# Patient Record
Sex: Male | Born: 1965 | Race: White | Hispanic: No | Marital: Married | Smoking: Current every day smoker
Health system: Southern US, Community
[De-identification: ages and names within clinical notes are randomized; demographics above are authoritative.]

## PROBLEM LIST (undated history)

## (undated) DIAGNOSIS — E669 Obesity, unspecified: Secondary | ICD-10-CM

## (undated) DIAGNOSIS — E119 Type 2 diabetes mellitus without complications: Secondary | ICD-10-CM

## (undated) DIAGNOSIS — Z720 Tobacco use: Secondary | ICD-10-CM

## (undated) DIAGNOSIS — I10 Essential (primary) hypertension: Secondary | ICD-10-CM

## (undated) DIAGNOSIS — E785 Hyperlipidemia, unspecified: Secondary | ICD-10-CM

## (undated) HISTORY — PX: KNEE SURGERY: SHX244

## (undated) HISTORY — PX: SHOULDER SURGERY: SHX246

## (undated) HISTORY — PX: BACK SURGERY: SHX140

---

## 2017-11-02 ENCOUNTER — Observation Stay
Admission: EM | Admit: 2017-11-02 | Discharge: 2017-11-03 | Disposition: A | Discharge status: Home / Self Care | Payer: Medicaid - Out of State | Attending: Internal Medicine | Admitting: Internal Medicine

## 2017-11-02 ENCOUNTER — Emergency Department: Payer: Medicaid - Out of State

## 2017-11-02 ENCOUNTER — Encounter: Payer: Self-pay | Admitting: Emergency Medicine

## 2017-11-02 ENCOUNTER — Other Ambulatory Visit: Payer: Self-pay

## 2017-11-02 DIAGNOSIS — R0789 Other chest pain: Principal | ICD-10-CM | POA: Insufficient documentation

## 2017-11-02 DIAGNOSIS — E119 Type 2 diabetes mellitus without complications: Secondary | ICD-10-CM

## 2017-11-02 DIAGNOSIS — Z7984 Long term (current) use of oral hypoglycemic drugs: Secondary | ICD-10-CM | POA: Diagnosis not present

## 2017-11-02 DIAGNOSIS — R079 Chest pain, unspecified: Secondary | ICD-10-CM

## 2017-11-02 DIAGNOSIS — Z79899 Other long term (current) drug therapy: Secondary | ICD-10-CM | POA: Insufficient documentation

## 2017-11-02 DIAGNOSIS — E785 Hyperlipidemia, unspecified: Secondary | ICD-10-CM | POA: Insufficient documentation

## 2017-11-02 DIAGNOSIS — F17200 Nicotine dependence, unspecified, uncomplicated: Secondary | ICD-10-CM | POA: Insufficient documentation

## 2017-11-02 DIAGNOSIS — I10 Essential (primary) hypertension: Secondary | ICD-10-CM | POA: Insufficient documentation

## 2017-11-02 HISTORY — DX: Type 2 diabetes mellitus without complications: E11.9

## 2017-11-02 HISTORY — DX: Obesity, unspecified: E66.9

## 2017-11-02 HISTORY — DX: Tobacco use: Z72.0

## 2017-11-02 HISTORY — DX: Essential (primary) hypertension: I10

## 2017-11-02 HISTORY — DX: Hyperlipidemia, unspecified: E78.5

## 2017-11-02 LAB — CBC
HCT: 47.5 % (ref 40.0–52.0)
Hemoglobin: 16 g/dL (ref 13.0–18.0)
MCH: 31.4 pg (ref 26.0–34.0)
MCHC: 33.7 g/dL (ref 32.0–36.0)
MCV: 93.1 fL (ref 80.0–100.0)
PLATELETS: 255 10*3/uL (ref 150–440)
RBC: 5.1 MIL/uL (ref 4.40–5.90)
RDW: 13.4 % (ref 11.5–14.5)
WBC: 13 10*3/uL — AB (ref 3.8–10.6)

## 2017-11-02 LAB — BASIC METABOLIC PANEL
Anion gap: 13 (ref 5–15)
BUN: 16 mg/dL (ref 6–20)
CHLORIDE: 101 mmol/L (ref 101–111)
CO2: 24 mmol/L (ref 22–32)
CREATININE: 1.16 mg/dL (ref 0.61–1.24)
Calcium: 9.1 mg/dL (ref 8.9–10.3)
GFR calc Af Amer: >60 mL/min (ref >60)
GFR calc non Af Amer: >60 mL/min (ref >60)
Glucose, Bld: 232 mg/dL — ABNORMAL HIGH (ref 65–99)
Potassium: 4.1 mmol/L (ref 3.5–5.1)
Sodium: 138 mmol/L (ref 135–145)

## 2017-11-02 LAB — TROPONIN I: Troponin I: <0.03 ng/mL (ref <0.03)

## 2017-11-02 LAB — GLUCOSE, CAPILLARY: GLUCOSE-CAPILLARY: 183 mg/dL — AB (ref 65–99)

## 2017-11-02 MED ORDER — ACETAMINOPHEN 325 MG PO TABS: 650.0000 mg | ORAL_TABLET | Freq: Four times a day (QID) | ORAL | Status: DC | PRN

## 2017-11-02 MED ORDER — NITROGLYCERIN 0.4 MG SL SUBL
0.4000 mg | SUBLINGUAL_TABLET | SUBLINGUAL | Status: DC | PRN
  Filled 2017-11-02 (×2): qty 1

## 2017-11-02 MED ORDER — ATORVASTATIN CALCIUM 10 MG PO TABS
10.0000 mg | ORAL_TABLET | Freq: Every day | ORAL | Status: DC
  Administered 2017-11-03: 10 mg via ORAL
  Filled 2017-11-02: qty 1

## 2017-11-02 MED ORDER — ACETAMINOPHEN 650 MG RE SUPP: 650.0000 mg | Freq: Four times a day (QID) | RECTAL | Status: DC | PRN

## 2017-11-02 MED ORDER — FENTANYL CITRATE (PF) 100 MCG/2ML IJ SOLN: 50.0000 ug | Freq: Once | INTRAMUSCULAR | Status: DC

## 2017-11-02 MED ORDER — ENOXAPARIN SODIUM 40 MG/0.4ML ~~LOC~~ SOLN: 40.0000 mg | SUBCUTANEOUS | Status: DC

## 2017-11-02 MED ORDER — ONDANSETRON HCL 4 MG PO TABS: 4.0000 mg | ORAL_TABLET | Freq: Four times a day (QID) | ORAL | Status: DC | PRN

## 2017-11-02 MED ORDER — ASPIRIN 81 MG PO CHEW
324.0000 mg | CHEWABLE_TABLET | Freq: Once | ORAL | Status: AC
  Administered 2017-11-02: 324 mg via ORAL

## 2017-11-02 MED ORDER — INSULIN ASPART 100 UNIT/ML ~~LOC~~ SOLN: 0.0000 [IU] | Freq: Every day | SUBCUTANEOUS | Status: DC

## 2017-11-02 MED ORDER — OXYCODONE HCL 5 MG PO TABS
5.0000 mg | ORAL_TABLET | ORAL | Status: DC | PRN
  Administered 2017-11-03 (×2): 5 mg via ORAL
  Filled 2017-11-02 (×2): qty 1

## 2017-11-02 MED ORDER — ASPIRIN 81 MG PO CHEW
CHEWABLE_TABLET | ORAL | Status: AC
  Filled 2017-11-02: qty 4

## 2017-11-02 MED ORDER — ONDANSETRON HCL 4 MG/2ML IJ SOLN: 4.0000 mg | Freq: Four times a day (QID) | INTRAMUSCULAR | Status: DC | PRN

## 2017-11-02 MED ORDER — FENTANYL CITRATE (PF) 100 MCG/2ML IJ SOLN
50.0000 ug | Freq: Once | INTRAMUSCULAR | Status: AC
  Administered 2017-11-02: 50 ug via INTRAVENOUS
  Filled 2017-11-02: qty 2

## 2017-11-02 MED ORDER — INSULIN ASPART 100 UNIT/ML ~~LOC~~ SOLN
0.0000 [IU] | Freq: Three times a day (TID) | SUBCUTANEOUS | Status: DC
  Administered 2017-11-03: 3 [IU] via SUBCUTANEOUS
  Filled 2017-11-02: qty 1

## 2017-11-02 MED ORDER — MORPHINE SULFATE (PF) 2 MG/ML IV SOLN
2.0000 mg | INTRAVENOUS | Status: DC | PRN
  Administered 2017-11-02 – 2017-11-03 (×2): 2 mg via INTRAVENOUS
  Filled 2017-11-02 (×2): qty 1

## 2017-11-02 MED ORDER — SODIUM CHLORIDE 0.9 % IV SOLN
INTRAVENOUS | Status: AC
  Administered 2017-11-02 – 2017-11-03 (×2): via INTRAVENOUS

## 2017-11-02 MED ORDER — NITROGLYCERIN 0.4 MG SL SUBL
0.4000 mg | SUBLINGUAL_TABLET | Freq: Once | SUBLINGUAL | Status: AC
  Administered 2017-11-02: 0.4 mg via SUBLINGUAL
  Filled 2017-11-02: qty 1

## 2017-11-02 NOTE — ED Provider Notes (Signed)
[facility] Emergency Department Provider Note  ____________________________________________   I have reviewed the triage vital signs and the nursing notes. Where available I have reviewed prior notes and, if possible and indicated, outside hospital notes.    HISTORY  Chief Complaint Chest Pain    HPI Raymond Strickland is a 52 y.o. male with a history of hypercholesterol, hypertension, diabetes, tobacco abuse, family history of CAD presents today with chest pain.  Pain is in the center of his chest as a pressure nonradiating is been there all day, not particularly exertional although sometimes seems slightly worse when he walks around.  Said no shortness of breath or pleuritic nature to it.  He denies fever or chills.  He has had a cough recently but that is better.  Also worse when he changes position in the bed that is really what he states makes it seem worse.  He has not had any tearing pain to his back, numbness or weakness.  He has never had any known cardiac history and he has not had a cardiac workup in the past.  He states he does not feel he has a pneumonia just a "smoker's cough" which is better today.  No personal family history of PE or DVT takes no exogenous estrogens has no recent surgery, no recent travel, no leg swelling.   No past medical history on file.  There are no active problems to display for this patient.   Past Surgical History:  Procedure Laterality Date  . BACK SURGERY    . KNEE SURGERY    . SHOULDER SURGERY      Prior to Admission medications   Not on File    Allergies Patient has no known allergies.  No family history on file.  Social History Social History   Tobacco Use  . Smoking status: Current Every Day Smoker  . Smokeless tobacco: Never Used  Substance Use Topics  . Alcohol use: Not on file  . Drug use: Not on file    Review of Systems Constitutional: No fever/chills Eyes: No visual changes. ENT: No  sore throat. No stiff neck no neck pain Cardiovascular:  + chest pain. Respiratory: Denies shortness of breath. Gastrointestinal:   no vomiting.  No diarrhea.  No constipation. Genitourinary: Negative for dysuria. Musculoskeletal: Negative lower extremity swelling Skin: Negative for rash. Neurological: Negative for severe headaches, focal weakness or numbness.   ____________________________________________   PHYSICAL EXAM:  VITAL SIGNS: ED Triage Vitals  Enc Vitals Group     BP 11/02/17 2029 (!) 161/88     Pulse Rate 11/02/17 2029 (!) 105     Resp 11/02/17 2029 20     Temp 11/02/17 2029 98 F (36.7 C)     Temp Source 11/02/17 2029 Oral     SpO2 11/02/17 2029 99 %     Weight 11/02/17 2027 220 lb (99.8 kg)     Height 11/02/17 2027 5\' 9"  (1.753 m)     Head Circumference --      Peak Flow --      Pain Score 11/02/17 2027 7     Pain Loc --      Pain Edu? --      Excl. in GC? --     Constitutional: Alert and oriented. Well appearing and in no acute distress. Eyes: Conjunctivae are normal Head: Atraumatic HEENT: No congestion/rhinnorhea. Mucous membranes are moist.  Oropharynx non-erythematous Neck:   Nontender with no meningismus, no masses, no stridor Cardiovascular: Normal rate,  regular rhythm. Grossly normal heart sounds.  Good peripheral circulation. Respiratory: Normal respiratory effort.  No retractions. Lungs CTAB. Chest: Tender palpation all through the right costochondral margin, this is reproductive of his pain to some degree.  No lesions or other pathology such as crepitus or flail chest noted Abdominal: Soft and nontender. No distention. No guarding no rebound Back:  There is no focal tenderness or step off.  there is no midline tenderness there are no lesions noted. there is no CVA tenderness Musculoskeletal: No lower extremity tenderness, no upper extremity tenderness. No joint effusions, no DVT signs strong distal pulses no edema Neurologic:  Normal speech and  language. No gross focal neurologic deficits are appreciated.  Skin:  Skin is warm, dry and intact. No rash noted. Psychiatric: Mood and affect are normal. Speech and behavior are normal.  ____________________________________________   LABS (all labs ordered are listed, but only abnormal results are displayed)  Labs Reviewed  BASIC METABOLIC PANEL - Abnormal; Notable for the following components:      Result Value   Glucose, Bld 232 (*)    All other components within normal limits  CBC - Abnormal; Notable for the following components:   WBC 13.0 (*)    All other components within normal limits  TROPONIN I    Pertinent labs  results that were available during my care of the patient were reviewed by me and considered in my medical decision making (see chart for details). ____________________________________________  EKG  I personally interpreted any EKGs ordered by me or triage Sinus tach rate 106, no acute ST elevation or depression nonspecific ST changes normal axis ____________________________________________  RADIOLOGY  Pertinent labs & imaging results that were available during my care of the patient were reviewed by me and considered in my medical decision making (see chart for details). If possible, patient and/or family made aware of any abnormal findings.  Dg Chest 2 View  Result Date: 11/02/2017 CLINICAL DATA:  Chest pain and shortness of Breath EXAM: CHEST - 2 VIEW COMPARISON:  None. FINDINGS: Cardiac shadow is within normal limits. The lungs are well aerated bilaterally. No focal infiltrate or sizable effusion is seen. Chronic appearing changes in the proximal right humerus are noted likely related to prior trauma. No acute bony abnormality is noted. IMPRESSION: No active cardiopulmonary disease. Electronically Signed   By: Alcide Clever M.D.   On: 11/02/2017 20:47   ____________________________________________    PROCEDURES  Procedure(s) performed:  None  Procedures  Critical Care performed: None  ____________________________________________   INITIAL IMPRESSION / ASSESSMENT AND PLAN / ED COURSE  Pertinent labs & imaging results that were available during my care of the patient were reviewed by me and considered in my medical decision making (see chart for details).  Patient here with reproducible chest wall pain however, he also has pretty much every risk factor I can think of for CAD including high cholesterol hyperlipidemia tobacco abuse diabetes and family history and obesity.  Thus even though I am reassured by his workup thus far as well as his findings, I think it would be in his best interest to spend the night and get a stress test.  I do not think the patient at this time shows symptoms of PE or dissection or pneumonia pneumothorax.    ____________________________________________   FINAL CLINICAL IMPRESSION(S) / ED DIAGNOSES  Final diagnoses:  None      This chart was dictated using voice recognition software.  Despite best efforts to proofread,  errors can occur which can change meaning.      Jeanmarie PlantMcShane, Shalunda Lindh A, MD 11/02/17 2137

## 2017-11-02 NOTE — ED Triage Notes (Signed)
.  Patient ambulatory to triage with steady gait, without difficulty or distress noted; pt reports since this morning having midsternal CP, [facility], denies hx of same

## 2017-11-02 NOTE — ED Notes (Signed)
Attempted to call report, RN passing medications and will call back shortly

## 2017-11-02 NOTE — ED Notes (Signed)
Sam RN, aware of bed assigned  

## 2017-11-02 NOTE — H&P (Signed)
[facility] [place] at [facility]   PATIENT NAME: Raymond Strickland    MR#:  [id number]  DATE OF BIRTH:  [birth year]  DATE OF ADMISSION:  11/02/2017  PRIMARY CARE PHYSICIAN: Patient, No Pcp Per   REQUESTING/REFERRING PHYSICIAN: Alphonzo Lemmings, MD  CHIEF COMPLAINT:   Chief Complaint  Patient presents with  . Chest Pain    HISTORY OF PRESENT ILLNESS:  Raymond Strickland  is a 52 y.o. male who presents with pain.  Patient states this pain started initially on the right side of his chest.  He noticed it when he woke up this morning.  It is been a persistent pain, and throughout the day he noticed it more in the center of his chest as well.  He states that he does also feel some tightness up towards his left neck.  His initial workup here in the ED was largely within normal limits, though he does have a number of risk factors for heart disease including family history, diabetes, tobacco abuse.  He denies any associated diaphoresis, shortness of breath, or other symptoms.  Hospitalist were called for admission for further evaluation.  PAST MEDICAL HISTORY:   Past Medical History:  Diagnosis Date  . Diabetes (HCC)   . HLD (hyperlipidemia)   . Hypertension   . Obesity   . Tobacco abuse      PAST SURGICAL HISTORY:   Past Surgical History:  Procedure Laterality Date  . BACK SURGERY    . KNEE SURGERY    . SHOULDER SURGERY       SOCIAL HISTORY:   Social History   Tobacco Use  . Smoking status: Current Every Day Smoker  . Smokeless tobacco: Never Used  Substance Use Topics  . Alcohol use: Yes    Alcohol/week: 0.0 - 0.6 oz    Frequency: Never     FAMILY HISTORY:   Family History  Problem Relation Age of Onset  . Heart disease Father      DRUG ALLERGIES:  No Known Allergies  MEDICATIONS AT HOME:   Prior to Admission medications   Medication Sig Start Date End Date Taking? Authorizing Provider  atorvastatin (LIPITOR) 10 MG tablet Take 10  mg by mouth daily. 10/19/17   [provider]  metFORMIN (GLUCOPHAGE) 500 MG tablet Take 1 tablet by mouth 2 (two) times daily. 10/21/17   [provider]    REVIEW OF SYSTEMS:  Review of Systems  Constitutional: Negative for chills, fever, malaise/fatigue and weight loss.  HENT: Negative for ear pain, hearing loss and tinnitus.   Eyes: Negative for blurred vision, double vision, pain and redness.  Respiratory: Negative for cough, hemoptysis and shortness of breath.   Cardiovascular: Positive for chest pain. Negative for palpitations, orthopnea and leg swelling.  Gastrointestinal: Negative for abdominal pain, constipation, diarrhea, nausea and vomiting.  Genitourinary: Negative for dysuria, frequency and hematuria.  Musculoskeletal: Negative for back pain, joint pain and neck pain.  Skin:       No acne, rash, or lesions  Neurological: Negative for dizziness, tremors, focal weakness and weakness.  Endo/Heme/Allergies: Negative for polydipsia. Does not bruise/bleed easily.  Psychiatric/Behavioral: Negative for depression. The patient is not nervous/anxious and does not have insomnia.      VITAL SIGNS:   Vitals:   11/02/17 2027 11/02/17 2029 11/02/17 2100 11/02/17 2130  BP:  (!) 161/88 (!) 159/83 133/81  Pulse:  (!) 105 95 91  Resp:  20 20 18   Temp:  98 F (36.7 C)  TempSrc:  Oral    SpO2:  99% 96% 95%  Weight: 99.8 kg (220 lb)     Height: 5\' 9"  (1.753 m)      Wt Readings from Last 3 Encounters:  11/02/17 99.8 kg (220 lb)    PHYSICAL EXAMINATION:  Physical Exam  Vitals reviewed. Constitutional: He is oriented to person, place, and time. He appears well-developed and well-nourished. No distress.  HENT:  Head: Normocephalic and atraumatic.  Mouth/Throat: Oropharynx is clear and moist.  Eyes: Conjunctivae and EOM are normal. Pupils are equal, round, and reactive to light. No scleral icterus.  Neck: Normal range of motion. Neck supple. No JVD present. No  thyromegaly present.  Cardiovascular: Normal rate, regular rhythm and intact distal pulses. Exam reveals no gallop and no friction rub.  No murmur heard. Respiratory: Effort normal and breath sounds normal. No respiratory distress. He has no wheezes. He has no rales. He exhibits tenderness.  GI: Soft. Bowel sounds are normal. He exhibits no distension. There is no tenderness.  Musculoskeletal: Normal range of motion. He exhibits no edema.  No arthritis, no gout  Lymphadenopathy:    He has no cervical adenopathy.  Neurological: He is alert and oriented to person, place, and time. No cranial nerve deficit.  No dysarthria, no aphasia  Skin: Skin is warm and dry. No rash noted. No erythema.  Psychiatric: He has a normal mood and affect. His behavior is normal. Judgment and thought content normal.    LABORATORY PANEL:   CBC Recent Labs  Lab 11/02/17 2030  WBC 13.0*  HGB 16.0  HCT 47.5  PLT 255   ------------------------------------------------------------------------------------------------------------------  Chemistries  Recent Labs  Lab 11/02/17 2030  NA 138  K 4.1  CL 101  CO2 24  GLUCOSE 232*  BUN 16  CREATININE 1.16  CALCIUM 9.1   ------------------------------------------------------------------------------------------------------------------  Cardiac Enzymes Recent Labs  Lab 11/02/17 2030  TROPONINI <0.03   ------------------------------------------------------------------------------------------------------------------  RADIOLOGY:  Dg Chest 2 View  Result Date: 11/02/2017 CLINICAL DATA:  Chest pain and shortness of Breath EXAM: CHEST - 2 VIEW COMPARISON:  None. FINDINGS: Cardiac shadow is within normal limits. The lungs are well aerated bilaterally. No focal infiltrate or sizable effusion is seen. Chronic appearing changes in the proximal right humerus are noted likely related to prior trauma. No acute bony abnormality is noted. IMPRESSION: No active  cardiopulmonary disease. Electronically Signed   By: Alcide Clever M.D.   On: 11/02/2017 20:47    EKG:   Orders placed or performed during the hospital encounter of 11/02/17  . ED EKG within 10 minutes  . ED EKG within 10 minutes    IMPRESSION AND PLAN:  Principal Problem:   Atypical chest pain -patient's chest pain is somewhat reproducible, but he also notes that there is a component which is not reproducible.  Unclear etiology at this time.  Initial troponin normal.  We will trend his cardiac enzymes tonight, get an echocardiogram and a cardiology consult. Active Problems:   HTN (hypertension) -continue home meds   Diabetes (HCC) -sliding scale insulin with corresponding glucose checks   HLD (hyperlipidemia) -home dose antilipid  All the records are reviewed and case discussed with ED provider. Management plans discussed with the patient and/or family.  DVT PROPHYLAXIS: SubQ lovenox  GI PROPHYLAXIS: None  ADMISSION STATUS: Observation  CODE STATUS: Full Code Status History    This patient does not have a recorded code status. Please follow your organizational policy for patients in this situation.  TOTAL TIME TAKING CARE OF THIS PATIENT: 40 minutes.   Tyeesha Riker FIELDING 11/02/2017, 10:10 PM  [facility]  Office  [phone]  CC: Primary care physician; Patient, No Pcp Per  Note:  This document was prepared using Dragon voice recognition software and may include unintentional dictation errors.

## 2017-11-02 NOTE — ED Notes (Signed)
Rolling upstairs with pt, 2A notified and gave report bedside

## 2017-11-03 ENCOUNTER — Observation Stay (HOSPITAL_BASED_OUTPATIENT_CLINIC_OR_DEPARTMENT_OTHER)
Admit: 2017-11-03 | Discharge: 2017-11-03 | Disposition: A | Discharge status: Home / Self Care | Payer: Medicaid - Out of State | Attending: Internal Medicine | Admitting: Internal Medicine

## 2017-11-03 ENCOUNTER — Observation Stay (HOSPITAL_BASED_OUTPATIENT_CLINIC_OR_DEPARTMENT_OTHER): Payer: Medicaid - Out of State

## 2017-11-03 DIAGNOSIS — R0789 Other chest pain: Secondary | ICD-10-CM

## 2017-11-03 DIAGNOSIS — I340 Nonrheumatic mitral (valve) insufficiency: Secondary | ICD-10-CM

## 2017-11-03 LAB — BASIC METABOLIC PANEL
Anion gap: 8 (ref 5–15)
BUN: 12 mg/dL (ref 6–20)
CALCIUM: 8.6 mg/dL — AB (ref 8.9–10.3)
CO2: 25 mmol/L (ref 22–32)
CREATININE: 0.97 mg/dL (ref 0.61–1.24)
Chloride: 104 mmol/L (ref 101–111)
GFR calc Af Amer: >60 mL/min (ref >60)
Glucose, Bld: 223 mg/dL — ABNORMAL HIGH (ref 65–99)
POTASSIUM: 4.2 mmol/L (ref 3.5–5.1)
SODIUM: 137 mmol/L (ref 135–145)

## 2017-11-03 LAB — CBC
HEMATOCRIT: 44.4 % (ref 40.0–52.0)
Hemoglobin: 15.1 g/dL (ref 13.0–18.0)
MCH: 31.6 pg (ref 26.0–34.0)
MCHC: 34 g/dL (ref 32.0–36.0)
MCV: 93 fL (ref 80.0–100.0)
PLATELETS: 232 10*3/uL (ref 150–440)
RBC: 4.78 MIL/uL (ref 4.40–5.90)
RDW: 13.5 % (ref 11.5–14.5)
WBC: 10.8 10*3/uL — AB (ref 3.8–10.6)

## 2017-11-03 LAB — ECHOCARDIOGRAM COMPLETE
HEIGHTINCHES: 69 in
WEIGHTICAEL: 3550.4 [oz_av]

## 2017-11-03 LAB — GLUCOSE, CAPILLARY
GLUCOSE-CAPILLARY: 163 mg/dL — AB (ref 65–99)
GLUCOSE-CAPILLARY: 143 mg/dL — AB (ref 65–99)
Glucose-Capillary: 243 mg/dL — ABNORMAL HIGH (ref 65–99)

## 2017-11-03 LAB — LIPID PANEL
Cholesterol: 146 mg/dL (ref 0–200)
HDL: 33 mg/dL — ABNORMAL LOW (ref >40)
LDL Cholesterol: 99 mg/dL (ref 0–99)
Total CHOL/HDL Ratio: 4.4 RATIO
Triglycerides: 71 mg/dL (ref <150)
VLDL: 14 mg/dL (ref 0–40)

## 2017-11-03 LAB — NM MYOCAR MULTI W/SPECT W/WALL MOTION / EF
CHL CUP NUCLEAR SDS: 0
CHL CUP NUCLEAR SRS: 3
LV dias vol: 97 mL (ref 62–150)
LV sys vol: 43 mL
NUC STRESS TID: 0.92
Peak HR: 110 {beats}/min
Percent HR: 64 %
Rest HR: 76 {beats}/min
SSS: 0

## 2017-11-03 LAB — TROPONIN I: Troponin I: <0.03 ng/mL (ref <0.03)

## 2017-11-03 LAB — FIBRIN DERIVATIVES D-DIMER (ARMC ONLY): Fibrin derivatives D-dimer (ARMC): 234.28 ng/mL (FEU) (ref 0.00–499.00)

## 2017-11-03 LAB — MAGNESIUM: MAGNESIUM: 2.1 mg/dL (ref 1.7–2.4)

## 2017-11-03 LAB — TSH: TSH: 0.465 u[IU]/mL (ref 0.350–4.500)

## 2017-11-03 MED ORDER — IPRATROPIUM-ALBUTEROL 0.5-2.5 (3) MG/3ML IN SOLN: 3.0000 mL | RESPIRATORY_TRACT | Status: DC | PRN

## 2017-11-03 MED ORDER — REGADENOSON 0.4 MG/5ML IV SOLN
0.4000 mg | Freq: Once | INTRAVENOUS | Status: AC
  Administered 2017-11-03: 0.4 mg via INTRAVENOUS

## 2017-11-03 MED ORDER — IPRATROPIUM-ALBUTEROL 0.5-2.5 (3) MG/3ML IN SOLN
3.0000 mL | Freq: Four times a day (QID) | RESPIRATORY_TRACT | Status: DC
  Filled 2017-11-03: qty 3

## 2017-11-03 MED ORDER — TECHNETIUM TC 99M TETROFOSMIN IV KIT
13.3000 | PACK | Freq: Once | INTRAVENOUS | Status: AC | PRN
  Administered 2017-11-03: 13.3 via INTRAVENOUS

## 2017-11-03 MED ORDER — FLUDEOXYGLUCOSE F - 18 (FDG) INJECTION: 32.1000 | Freq: Once | INTRAVENOUS | Status: DC | PRN

## 2017-11-03 MED ORDER — ASPIRIN EC 81 MG PO TBEC: 81.0000 mg | DELAYED_RELEASE_TABLET | Freq: Every day | ORAL | 0 refills | Status: AC

## 2017-11-03 MED ORDER — TECHNETIUM TC 99M TETROFOSMIN IV KIT
32.1000 | PACK | Freq: Once | INTRAVENOUS | Status: AC | PRN
  Administered 2017-11-03: 32.1 via INTRAVENOUS

## 2017-11-03 MED ORDER — NICOTINE 14 MG/24HR TD PT24: 14.0000 mg | MEDICATED_PATCH | Freq: Every day | TRANSDERMAL | 0 refills | Status: AC

## 2017-11-03 MED ORDER — ALBUTEROL SULFATE HFA 108 (90 BASE) MCG/ACT IN AERS: 2.0000 | INHALATION_SPRAY | Freq: Four times a day (QID) | RESPIRATORY_TRACT | 0 refills | Status: AC | PRN

## 2017-11-03 MED ORDER — NICOTINE 14 MG/24HR TD PT24
14.0000 mg | MEDICATED_PATCH | Freq: Every day | TRANSDERMAL | Status: DC
  Administered 2017-11-03: 14 mg via TRANSDERMAL
  Filled 2017-11-03: qty 1

## 2017-11-03 NOTE — Progress Notes (Signed)
[facility] - [place] at [facility]   PATIENT NAME: Raymond Strickland    MR#:  [id number]  DATE OF BIRTH:  [birth year]  SUBJECTIVE:  CHIEF COMPLAINT:   Chief Complaint  Patient presents with  . Chest Pain  Complained of chest tightness with deep breathing only  REVIEW OF SYSTEMS:  CONSTITUTIONAL: No fever, fatigue or weakness.  EYES: No blurred or double vision.  EARS, NOSE, AND THROAT: No tinnitus or ear pain.  RESPIRATORY: No cough, shortness of breath, wheezing or hemoptysis.  CARDIOVASCULAR: No chest pain, orthopnea, edema.  GASTROINTESTINAL: No nausea, vomiting, diarrhea or abdominal pain.  GENITOURINARY: No dysuria, hematuria.  ENDOCRINE: No polyuria, nocturia,  HEMATOLOGY: No anemia, easy bruising or bleeding SKIN: No rash or lesion. MUSCULOSKELETAL: No joint pain or arthritis.   NEUROLOGIC: No tingling, numbness, weakness.  PSYCHIATRY: No anxiety or depression.   ROS  DRUG ALLERGIES:  No Known Allergies  VITALS:  Blood pressure (!) 107/56, pulse 80, temperature 97.8 F (36.6 C), temperature source Oral, resp. rate 18, height 5\' 9"  (1.753 m), weight 100.7 kg (221 lb 14.4 oz), SpO2 95 %.  PHYSICAL EXAMINATION:  GENERAL:  52 y.o.-year-old patient lying in the bed with no acute distress.  EYES: Pupils equal, round, reactive to light and accommodation. No scleral icterus. Extraocular muscles intact.  HEENT: Head atraumatic, normocephalic. Oropharynx and nasopharynx clear.  NECK:  Supple, no jugular venous distention. No thyroid enlargement, no tenderness.  LUNGS: Normal breath sounds bilaterally, no wheezing, rales,rhonchi or crepitation. No use of accessory muscles of respiration.  CARDIOVASCULAR: S1, S2 normal. No murmurs, rubs, or gallops.  ABDOMEN: Soft, nontender, nondistended. Bowel sounds present. No organomegaly or mass.  EXTREMITIES: No pedal edema, cyanosis, or clubbing.  NEUROLOGIC: Cranial nerves II through XII are intact. Muscle  strength 5/5 in all extremities. Sensation intact. Gait not checked.  PSYCHIATRIC: The patient is alert and oriented x 3.  SKIN: No obvious rash, lesion, or ulcer.   Physical Exam LABORATORY PANEL:   CBC Recent Labs  Lab 11/03/17 0507  WBC 10.8*  HGB 15.1  HCT 44.4  PLT 232   ------------------------------------------------------------------------------------------------------------------  Chemistries  Recent Labs  Lab 11/03/17 0507 11/03/17 1213  NA 137  --   K 4.2  --   CL 104  --   CO2 25  --   GLUCOSE 223*  --   BUN 12  --   CREATININE 0.97  --   CALCIUM 8.6*  --   MG  --  2.1   ------------------------------------------------------------------------------------------------------------------  Cardiac Enzymes Recent Labs  Lab 11/03/17 0507 11/03/17 1213  TROPONINI <0.03 <0.03   ------------------------------------------------------------------------------------------------------------------  RADIOLOGY:  Dg Chest 2 View  Result Date: 11/02/2017 CLINICAL DATA:  Chest pain and shortness of Breath EXAM: CHEST - 2 VIEW COMPARISON:  None. FINDINGS: Cardiac shadow is within normal limits. The lungs are well aerated bilaterally. No focal infiltrate or sizable effusion is seen. Chronic appearing changes in the proximal right humerus are noted likely related to prior trauma. No acute bony abnormality is noted. IMPRESSION: No active cardiopulmonary disease. Electronically Signed   By: Alcide Clever M.D.   On: 11/02/2017 20:47    ASSESSMENT AND PLAN:  *  Atypical chest pain  For nuclear medicine stress testing-if negative will discharge to home with outpatient follow-up with primary care provider and to 3 days for reevaluation Other possible etiologies include probable acute pleuritic process, mild COPD, acute bronchitis, pleurisy, acute viral syndrome Patient advised to quit smoking, started on inhaler  as needed  *Chronic tobacco smoking abuse/dependency Nicotine patch  and cessation counseling while in house  *Chronic diabetes mellitus type 2 Stable on current regiment  *Chronic benign essential hypertension Stable continue home regiment  All the records are reviewed and case discussed with Care Management/Social Workerr. Management plans discussed with the patient, family and they are in agreement.  CODE STATUS: full  TOTAL TIME TAKING CARE OF THIS PATIENT: 35 minutes.     POSSIBLE D/C IN 0-1 DAYS, DEPENDING ON CLINICAL CONDITION.   Evelena AsaMontell D Demarrio Menges M.D on 11/03/2017   Between 7am to 6pm - Pager - [phone]  After 6pm go to [email or web address] - password [email or web address]  [facility] [place] [facility]  Office  [phone]  CC: Primary care physician; Patient, No Pcp Per  Note: This dictation was prepared with Dragon dictation along with smaller phrase technology. Any transcriptional errors that result from this process are unintentional.

## 2017-11-03 NOTE — Progress Notes (Signed)
*  PRELIMINARY RESULTS* Echocardiogram 2D Echocardiogram has been performed.  Raymond BlueHege, Raymond Strickland 11/03/2017, 12:14 PM

## 2017-11-03 NOTE — Progress Notes (Signed)
Pt complains of increased pain with inspiration. MD notified. RN suggest d-dimer add to morning labs. Order received. I will continue to assess.

## 2017-11-03 NOTE — Consult Note (Signed)
Cardiology Consultation:   Patient ID: Raymond Strickland; [id number]; 1966/06/07   Admit date: 11/02/2017 Date of Consult: 11/03/2017  Primary Care Provider: Patient, No Pcp Per Primary Cardiologist: New to [facility] - consult by Arida   Patient Profile:   Raymond Strickland is a 52 y.o. male with a hx of DM, HTN, HLD, ongoing tobacco abuse, and obesity who is being seen today for the evaluation of atypical/pleuritic chest pain at the request of Dr. Anne Hahn.  History of Present Illness:   Raymond Strickland has no previously known cardiac history. He is a current everyday smoker of 1 to 1.5 packs daily for the past 30 years. 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 His mother and father have known CAD, developing in their 92s. He has recently been dealing with a URI, that is improving. On 3/6 he developed right-sided, pleuritic chest pain that is only noted with coughing and deep inspiration. Pain is not exertional. There is associated SOB, otherwise no associated symptoms. Because of his chest pain he came to the ED.   Upon the patient's arrival to [facility] they were found to have BP 161 systolic trending to 112 systolic, HR 105-->73 bpm, temp 98, oxygen saturation 99% on room air, weight 220 pounds. EKG showed sinus tachycardia with nonspecific st/t changes, CXR showed no active cardiopulmonary disease. Labs showed troponin negative x 2, WBC 13.0-->10.8, HGB 16.0, PLT 255, glucose 232-->223, SCr 1.16-->0.97, K+ 4.1. In the ED he was given ASA 324 mg x 1, Fentanyl, morphine, and SL NTG.   Past Medical History:  Diagnosis Date  . Diabetes (HCC)   . HLD (hyperlipidemia)   . Hypertension   . Obesity   . Tobacco abuse     Past Surgical History:  Procedure Laterality Date  . BACK SURGERY    . KNEE SURGERY    . SHOULDER SURGERY       Home Meds: Prior to Admission medications   Medication Sig Start Date End Date Taking? Authorizing Provider  metFORMIN  (GLUCOPHAGE) 500 MG tablet Take 1 tablet by mouth 2 (two) times daily. 10/21/17  Yes [provider]  atorvastatin (LIPITOR) 10 MG tablet Take 10 mg by mouth daily. 10/19/17   [provider]    Inpatient Medications: Scheduled Meds: . aspirin      . atorvastatin  10 mg Oral Daily  . enoxaparin (LOVENOX) injection  40 mg Subcutaneous Q24H  . insulin aspart  0-5 Units Subcutaneous QHS  . insulin aspart  0-9 Units Subcutaneous TID WC   Continuous Infusions: . sodium chloride 50 mL/hr at 11/02/17 2322   PRN Meds: acetaminophen **OR** acetaminophen, morphine injection, [COMPLETED] nitroGLYCERIN **FOLLOWED BY** nitroGLYCERIN, ondansetron **OR** ondansetron (ZOFRAN) IV, oxyCODONE  Allergies:  No Known Allergies  Social History:   Social History   Socioeconomic History  . Marital status: Married    Spouse name: Not on file  . Number of children: Not on file  . Years of education: Not on file  . Highest education level: Not on file  Social Needs  . Financial resource strain: Not on file  . Food insecurity - worry: Not on file  . Food insecurity - inability: Not on file  . Transportation needs - medical: Not on file  . Transportation needs - non-medical: Not on file  Occupational History  . Not on file  Tobacco Use  . Smoking status: Current Every Day Smoker  . Smokeless tobacco: Never Used  Substance  and Sexual Activity  . Alcohol use: Yes    Alcohol/week: 0.0 - 0.6 oz    Frequency: Never  . Drug use: No  . Sexual activity: Not on file  Other Topics Concern  . Not on file  Social History Narrative  . Not on file     Family History:   Family History  Problem Relation Age of Onset  . Heart disease Father     ROS:  Review of Systems  Constitutional: Positive for malaise/fatigue. Negative for chills, diaphoresis, fever and weight loss.  HENT: Negative for congestion.   Eyes: Negative for discharge and redness.  Respiratory: Positive for shortness of  breath. Negative for cough, hemoptysis, sputum production and wheezing.   Cardiovascular: Positive for chest pain. Negative for palpitations, orthopnea, claudication, leg swelling and PND.  Gastrointestinal: Negative for abdominal pain, blood in stool, heartburn, melena, nausea and vomiting.  Genitourinary: Negative for hematuria.  Musculoskeletal: Negative for falls and myalgias.  Skin: Negative for rash.  Neurological: Positive for weakness. Negative for dizziness, tingling, tremors, sensory change, speech change, focal weakness and loss of consciousness.  Endo/Heme/Allergies: Does not bruise/bleed easily.  Psychiatric/Behavioral: Negative for substance abuse. The patient is not nervous/anxious.   All other systems reviewed and are negative.     Physical Exam/Data:   Vitals:   11/02/17 2309 11/02/17 2339 11/03/17 0522 11/03/17 0533  BP: (!) 145/87 132/72 (!) 100/54 112/61  Pulse: 92 90 84 84  Resp: 16  18   Temp: 97.9 F (36.6 C)  98.6 F (37 C)   TempSrc: Oral     SpO2: 93%  98%   Weight:      Height:        Intake/Output Summary (Last 24 hours) at 11/03/2017 0817 Last data filed at 11/03/2017 0238 Gross per 24 hour  Intake 150 ml  Output 900 ml  Net -750 ml   Filed Weights   11/02/17 2027 11/02/17 2306  Weight: 220 lb (99.8 kg) 221 lb 14.4 oz (100.7 kg)   Body mass index is 32.77 kg/m.   Physical Exam: General: Well developed, well nourished, in no acute distress. Head: Normocephalic, atraumatic, sclera non-icteric, no xanthomas, nares without discharge. Neck: Negative for carotid bruits. JVD not elevated. Lungs: Clear bilaterally to auscultation without wheezes, rales, or rhonchi. Breathing is unlabored. Heart: RRR with S1 S2. No murmurs, rubs, or gallops appreciated. Abdomen: Soft, non-tender, non-distended with normoactive bowel sounds. No hepatomegaly. No rebound/guarding. No obvious abdominal masses. Msk:  Strength and tone appear normal for age. Extremities: No  clubbing or cyanosis. No edema. Distal pedal pulses are 2+ and equal bilaterally. Neuro: Alert and oriented X 3. No facial asymmetry. No focal deficit. Moves all extremities spontaneously. Psych:  Responds to questions appropriately with a normal affect.   EKG:  The EKG was personally reviewed and demonstrates: sinus tachycardia, 106 bpm, nonspecific st/t changes Telemetry:  Telemetry was personally reviewed and demonstrates: NSR with occasional PVCs  Weights: [facility] Weights   11/02/17 2027 11/02/17 2306  Weight: 220 lb (99.8 kg) 221 lb 14.4 oz (100.7 kg)    Relevant CV Studies: none  Laboratory Data:  Chemistry Recent Labs  Lab 11/02/17 2030 11/03/17 0507  NA 138 137  K 4.1 4.2  CL 101 104  CO2 24 25  GLUCOSE 232* 223*  BUN 16 12  CREATININE 1.16 0.97  CALCIUM 9.1 8.6*  GFRNONAA >60 >60  GFRAA >60 >60  ANIONGAP 13 8    No results for input(s): PROT,  ALBUMIN, AST, ALT, ALKPHOS, BILITOT in the last 168 hours. Hematology Recent Labs  Lab 11/02/17 2030 11/03/17 0507  WBC 13.0* 10.8*  RBC 5.10 4.78  HGB 16.0 15.1  HCT 47.5 44.4  MCV 93.1 93.0  MCH 31.4 31.6  MCHC 33.7 34.0  RDW 13.4 13.5  PLT 255 232   Cardiac Enzymes Recent Labs  Lab 11/02/17 2030 11/02/17 2359 11/03/17 0507  TROPONINI <0.03 <0.03 <0.03   No results for input(s): TROPIPOC in the last 168 hours.  BNPNo results for input(s): BNP, PROBNP in the last 168 hours.  DDimer No results for input(s): DDIMER in the last 168 hours.  Radiology/Studies:  Dg Chest 2 View  Result Date: 11/02/2017 IMPRESSION: No active cardiopulmonary disease. Electronically Signed   By: Alcide CleverMark  Lukens M.D.   On: 11/02/2017 20:47    Assessment and Plan:   1. Chest pain with moderate risk for cardiac etiology: -Currently chest pain free unless he takes a deep breath or coughs -Pain is very atypical, though he does have significant risk factors for CAD as detailed above -Schedule nuclear stress test today, if normal no  further ischemic workup at this time and recommend IM evaluate him for noncardiac etiologies for his symptoms, including evaluation for PE -Echo -ASA  2. DM: -Metformin on hold while admitted -SSI per IM -Check A1c  3. HTN: -Improved -BP has been on the soft side this morning which precludes addition of medications  4. HLD: -Check lipid panel -Lipitor, titrate as needed  5. PVCs: -Asymptomatic  -Check magnesium and TSH -Potassium at goal -Consider outpatient cardiac monitoring to quantify  -Check echo as above  6. SOB: -Cardiac evaluation as above -Recommend evaluation for PE as well given the pleuritic nature of his pain and SOB as well as significant risk factors for PE, defer to IM  7. Tobacco abuse: -Cessation advised   8. Obesity: -Weight loss advised   For questions or updates, please contact [facility] Please consult [email or web address] for contact info under Cardiology/STEMI.   Signed, Eula Listenyan Erian Lariviere, PA-C [facility] Pager: [phone] 11/03/2017, 8:17 AM

## 2017-11-04 LAB — HIV ANTIBODY (ROUTINE TESTING W REFLEX): HIV SCREEN 4TH GENERATION: NONREACTIVE

## 2017-11-04 LAB — HEMOGLOBIN A1C
Hgb A1c MFr Bld: 9.2 % — ABNORMAL HIGH (ref 4.8–5.6)
MEAN PLASMA GLUCOSE: 217 mg/dL

## 2017-11-04 NOTE — Discharge Summary (Deleted)
  The note originally documented on this encounter has been moved the the encounter in which it belongs.  

## 2017-11-04 NOTE — Discharge Summary (Signed)
[facility] [place] [facility]   PATIENT NAME: Raymond Strickland    MR#:  [id number]  DATE OF BIRTH:  Jan 17, 1966  DATE OF ADMISSION:  11/03/2017 ADMITTING PHYSICIAN: No admitting provider for patient encounter.  DATE OF DISCHARGE: 11/03/2017 11:59 PM  PRIMARY CARE PHYSICIAN: Patient, No Pcp Per    ADMISSION DIAGNOSIS:  No admission diagnoses are documented for this encounter.  DISCHARGE DIAGNOSIS:  Active Problems:   * No active hospital problems. *   SECONDARY DIAGNOSIS:   Past Medical History:  Diagnosis Date  . Diabetes (HCC)   . HLD (hyperlipidemia)   . Hypertension   . Obesity   . Tobacco abuse     HOSPITAL COURSE:  *Atypical chest pain  Referred to the observation unit, cardiac enzymes negative for acute coronary syndrome, cardiac nuclear medicine stress testing was a negative study, patient was discharged home with follow-up with primary care provider   *Chronic tobacco smoking abuse/dependency Nicotine patch and cessation counseling while in house  *Chronic diabetes mellitus type 2 Stable on current regiment  *Chronic benign essential hypertension Stable continue home regiment  DISCHARGE CONDITIONS:  On the day of discharge patient is afebrile, hemodynamically stable, tolerating diet, ready for discharge to home, for more specific details please see chart   CONSULTS OBTAINED:    DRUG ALLERGIES:  No Known Allergies  DISCHARGE MEDICATIONS:   Allergies as of 11/03/2017   No Known Allergies     Medication List    Notice   This visit has been closed. A record of the med list at the time of the visit is not available.      DISCHARGE INSTRUCTIONS:    If you experience worsening of your admission symptoms, develop shortness of breath, life threatening emergency, suicidal or homicidal thoughts you must seek medical attention immediately by calling 911 or calling your MD immediately  if symptoms less severe.  You  Must read complete instructions/literature along with all the possible adverse reactions/side effects for all the Medicines you take and that have been prescribed to you. Take any new Medicines after you have completely understood and accept all the possible adverse reactions/side effects.   Please note  You were cared for by a hospitalist during your hospital stay. If you have any questions about your discharge medications or the care you received while you were in the hospital after you are discharged, you can call the unit and asked to speak with the hospitalist on call if the hospitalist that took care of you is not available. Once you are discharged, your primary care physician will handle any further medical issues. Please note that NO REFILLS for any discharge medications will be authorized once you are discharged, as it is imperative that you return to your primary care physician (or establish a relationship with a primary care physician if you do not have one) for your aftercare needs so that they can reassess your need for medications and monitor your lab values.    Today   CHIEF COMPLAINT:  No chief complaint on file.   HISTORY OF PRESENT ILLNESS:  52 y.o. male who presents with pain.  Patient states this pain started initially on the right side of his chest.  He noticed it when he woke up this morning.  It is been a persistent pain, and throughout the day he noticed it more in the center of his chest as well.  He states that he does also feel some tightness up towards  his left neck.  His initial workup here in the ED was largely within normal limits, though he does have a number of risk factors for heart disease including family history, diabetes, tobacco abuse.  He denies any associated diaphoresis, shortness of breath, or other symptoms.  Hospitalist were called for admission for further evaluation.  VITAL SIGNS:  There were no vitals taken for this visit.  I/O:    Intake/Output  Summary (Last 24 hours) at 11/04/2017 0718 Last data filed at 11/03/2017 1247 Gross per 24 hour  Intake -  Output 700 ml  Net -700 ml    PHYSICAL EXAMINATION:  GENERAL:  52 y.o.-year-old patient lying in the bed with no acute distress.  EYES: Pupils equal, round, reactive to light and accommodation. No scleral icterus. Extraocular muscles intact.  HEENT: Head atraumatic, normocephalic. Oropharynx and nasopharynx clear.  NECK:  Supple, no jugular venous distention. No thyroid enlargement, no tenderness.  LUNGS: Normal breath sounds bilaterally, no wheezing, rales,rhonchi or crepitation. No use of accessory muscles of respiration.  CARDIOVASCULAR: S1, S2 normal. No murmurs, rubs, or gallops.  ABDOMEN: Soft, non-tender, non-distended. Bowel sounds present. No organomegaly or mass.  EXTREMITIES: No pedal edema, cyanosis, or clubbing.  NEUROLOGIC: Cranial nerves II through XII are intact. Muscle strength 5/5 in all extremities. Sensation intact. Gait not checked.  PSYCHIATRIC: The patient is alert and oriented x 3.  SKIN: No obvious rash, lesion, or ulcer.   DATA REVIEW:   CBC Recent Labs  Lab 11/03/17 0507  WBC 10.8*  HGB 15.1  HCT 44.4  PLT 232    Chemistries  Recent Labs  Lab 11/03/17 0507 11/03/17 1213  NA 137  --   K 4.2  --   CL 104  --   CO2 25  --   GLUCOSE 223*  --   BUN 12  --   CREATININE 0.97  --   CALCIUM 8.6*  --   MG  --  2.1    Cardiac Enzymes Recent Labs  Lab 11/03/17 1213  TROPONINI <0.03    Microbiology Results  No results found for this or any previous visit.  RADIOLOGY:  Dg Chest 2 View  Result Date: 11/02/2017 CLINICAL DATA:  Chest pain and shortness of Breath EXAM: CHEST - 2 VIEW COMPARISON:  None. FINDINGS: Cardiac shadow is within normal limits. The lungs are well aerated bilaterally. No focal infiltrate or sizable effusion is seen. Chronic appearing changes in the proximal right humerus are noted likely related to prior trauma. No acute  bony abnormality is noted. IMPRESSION: No active cardiopulmonary disease. Electronically Signed   By: Alcide CleverMark  Lukens M.D.   On: 11/02/2017 20:47   Nm Myocar Multi W/spect W/wall Motion / Ef  Result Date: 11/03/2017 Pharmacological myocardial perfusion imaging study with no significant  ischemia Normal wall motion, EF estimated at 46% No EKG changes concerning for ischemia at peak stress or in recovery. Low risk scan, echocardiogram documenting normal ejection fraction greater than 55% Signed, Dossie Arbourim Gollan, MD, Ph.D [facility]    EKG:   Orders placed or performed during the hospital encounter of 11/02/17  . ED EKG within 10 minutes  . ED EKG within 10 minutes      Management plans discussed with the patient, family and they are in agreement.  CODE STATUS:  Code Status History    Date Active Date Inactive Code Status Order ID Comments User Context   11/02/2017 23:16 11/03/2017 21:26 Full Code [id number]  Oralia ManisWillis, David, MD Inpatient  TOTAL TIME TAKING CARE OF THIS PATIENT: 45 minutes.    Evelena Asa Salary M.D on 11/04/2017 at 7:18 AM  Between 7am to 6pm - Pager - [phone]  After 6pm go to [email or web address] - password [email or web address]  [facility]  Office  [phone]  CC: Primary care physician; Patient, No Pcp Per   Note: This dictation was prepared with Dragon dictation along with smaller phrase technology. Any transcriptional errors that result from this process are unintentional.

## 2019-05-18 IMAGING — CR DG CHEST 2V
1 series · 2 of 2 positions shown · non-contrast
Comparison: None.

CLINICAL DATA: Chest pain and shortness of Breath

EXAM:
CHEST - 2 VIEW

[Series 1: dg chest 2 view · 0.14mm/px · 2 of 2 slices shown]
[im 1/2]
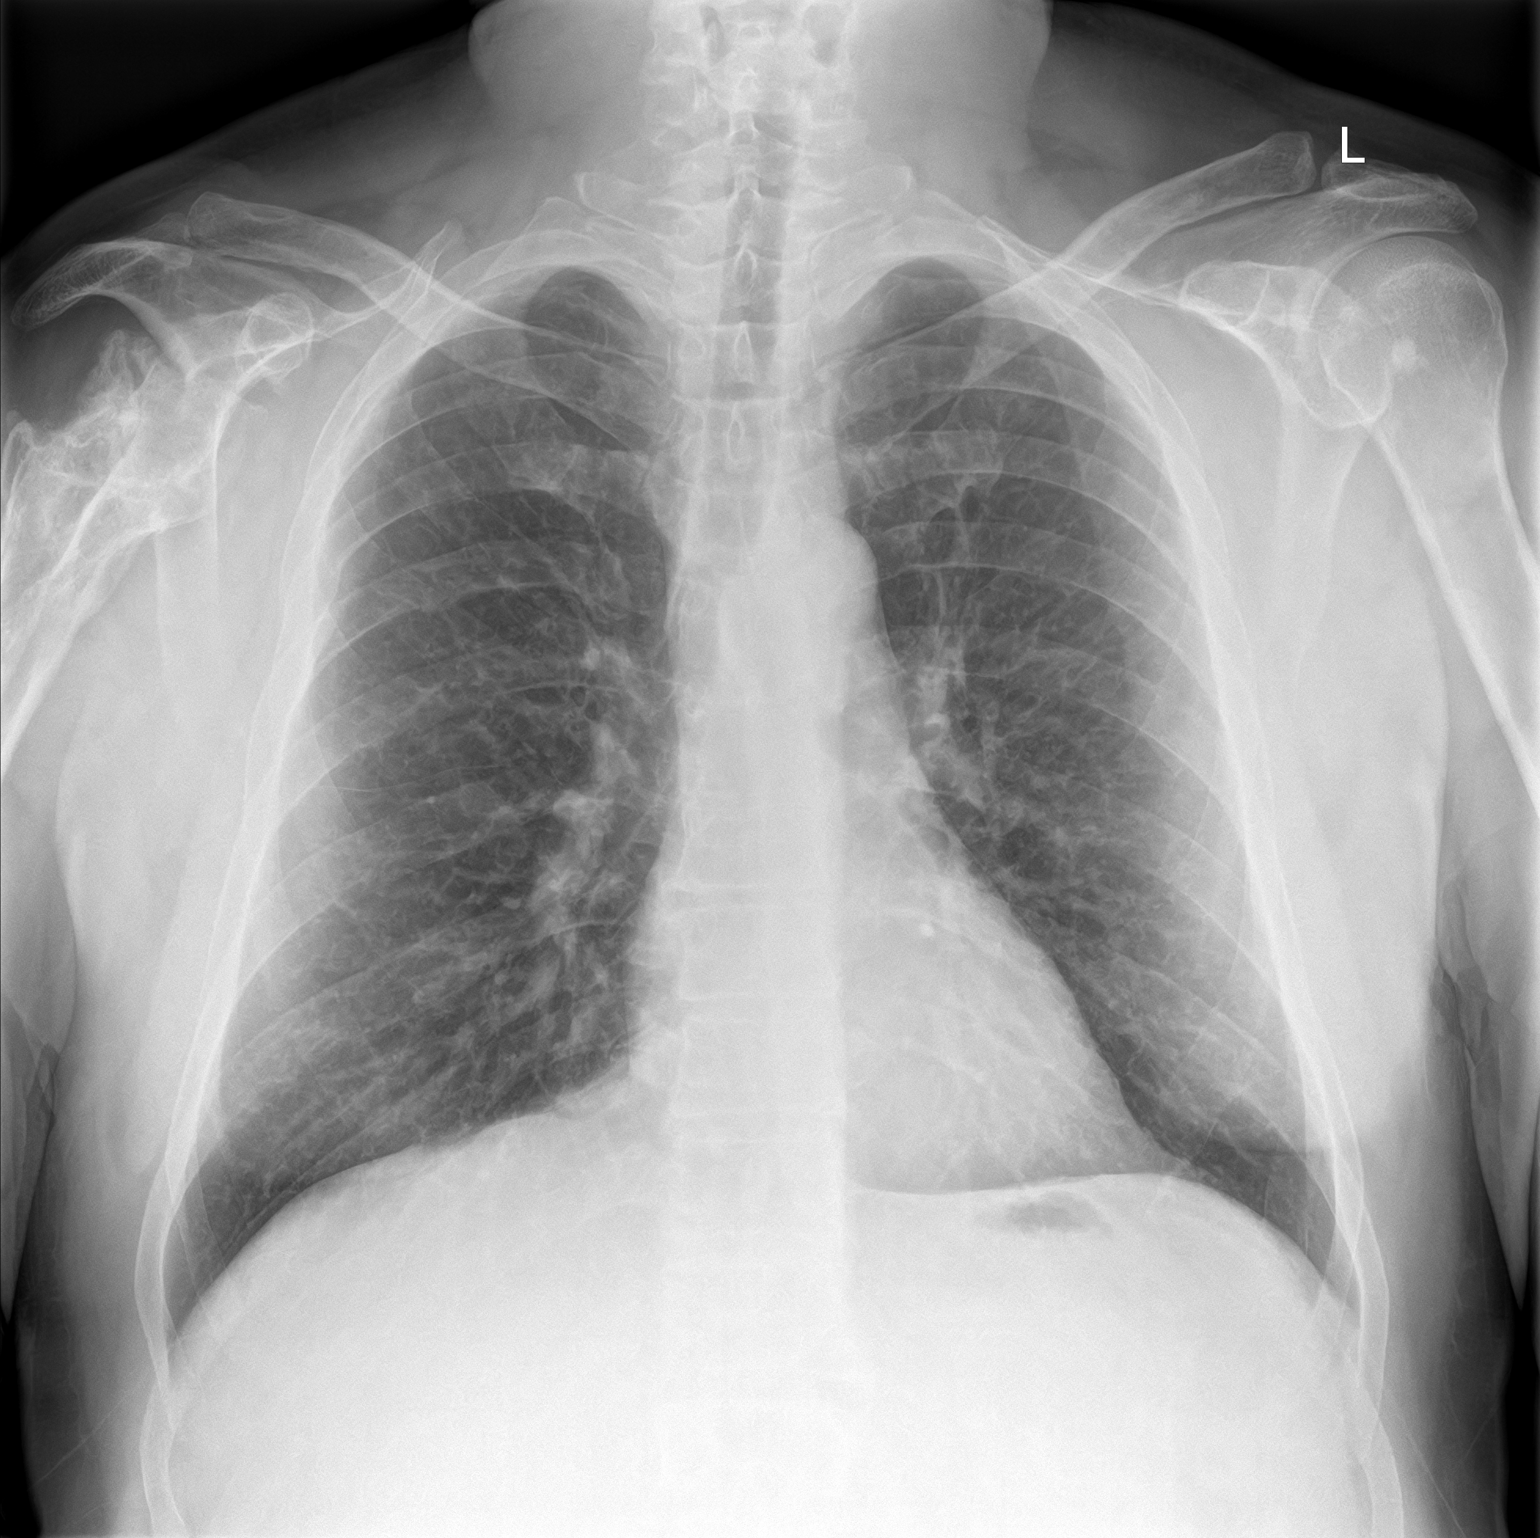
[im 2/2]
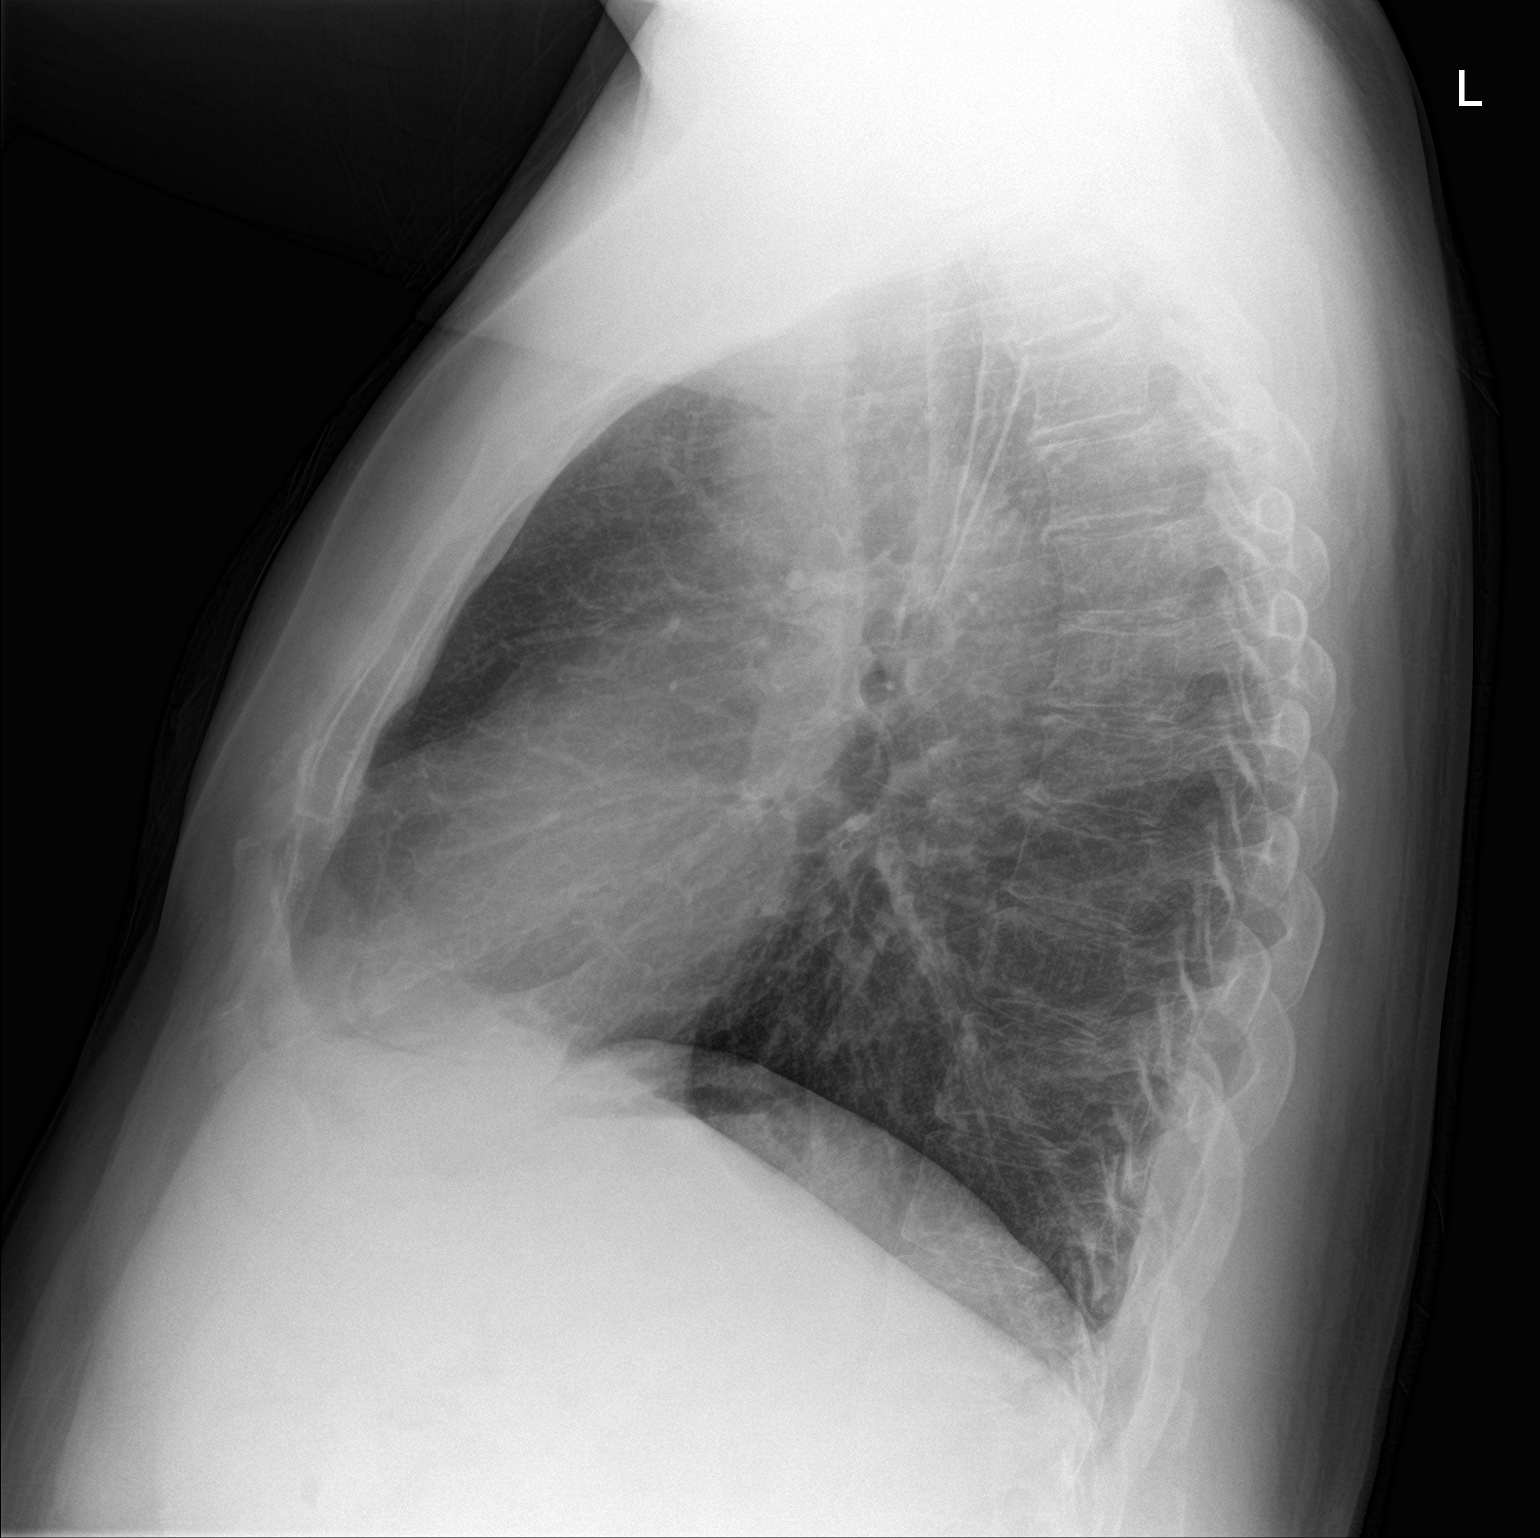

[2 of 2 positions shown; findings below may reference images not displayed]

FINDINGS: Cardiac shadow is within normal limits. The lungs are well aerated
bilaterally. No focal infiltrate or sizable effusion is seen.
Chronic appearing changes in the proximal right humerus are noted
likely related to prior trauma. No acute bony abnormality is noted.
IMPRESSION: No active cardiopulmonary disease.
